# Patient Record
Sex: Male | Born: 1960 | Race: Black or African American | Hispanic: No | Marital: Married | Smoking: Never smoker
Health system: Southern US, Community
[De-identification: ages and names within clinical notes are randomized; demographics above are authoritative.]

## PROBLEM LIST (undated history)

## (undated) HISTORY — PX: HAND SURGERY: SHX662

---

## 2002-12-03 ENCOUNTER — Emergency Department (HOSPITAL_COMMUNITY): Admission: EM | Admit: 2002-12-03 | Discharge: 2002-12-04 | Payer: Self-pay | Admitting: Emergency Medicine

## 2005-02-20 ENCOUNTER — Emergency Department (HOSPITAL_COMMUNITY): Admission: EM | Admit: 2005-02-20 | Discharge: 2005-02-21 | Payer: Self-pay | Admitting: Emergency Medicine

## 2006-03-25 ENCOUNTER — Emergency Department (HOSPITAL_COMMUNITY): Admission: EM | Admit: 2006-03-25 | Discharge: 2006-03-25 | Payer: Self-pay | Admitting: Emergency Medicine

## 2010-05-21 ENCOUNTER — Emergency Department (HOSPITAL_COMMUNITY): Admission: EM | Admit: 2010-05-21 | Discharge: 2010-05-21 | Payer: Self-pay | Admitting: Emergency Medicine

## 2010-10-17 IMAGING — CT CT ABD-PELV W/O CM
1 series · 14 of 20 positions shown, 19 images · non-contrast
Comparison: None.

CLINICAL DATA: Evaluate for kidney stone

CT ABDOMEN AND PELVIS WITHOUT CONTRAST
TECHNIQUE: Multidetector CT imaging of the abdomen and pelvis was
performed following the standard protocol without intravenous
contrast.

[Series 4: lung windows · axial · 0.76mm/px · z∈[-170,-85]mm · 14 of 20 slices shown, 19 images]
[im 2/20  soft-tissue]
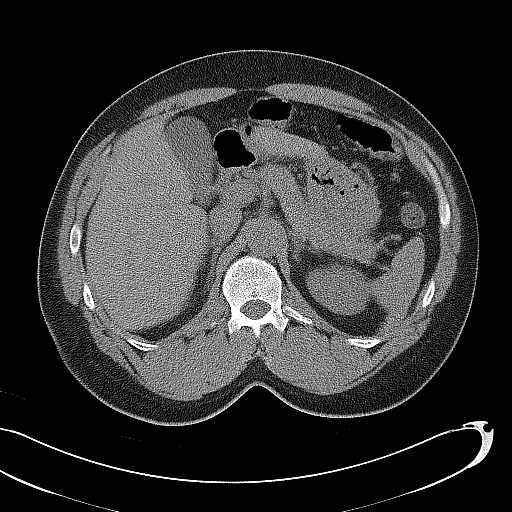
[im 2/20  bone]
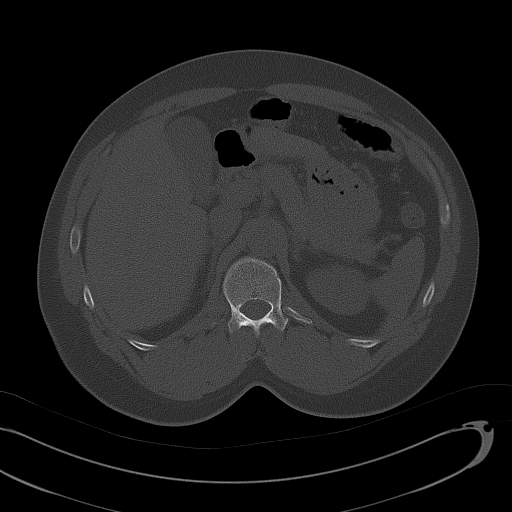
[im 4/20  soft-tissue]
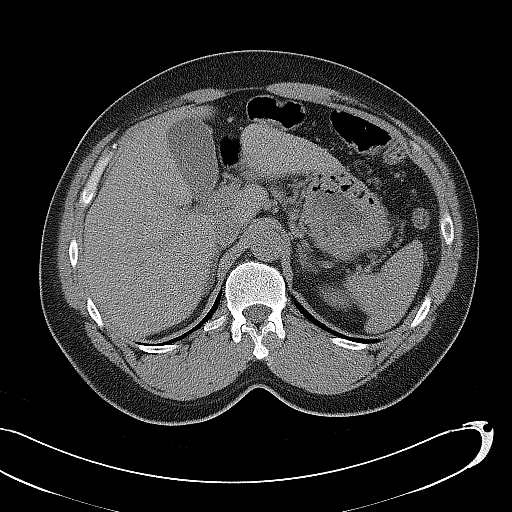
[im 5/20  soft-tissue]
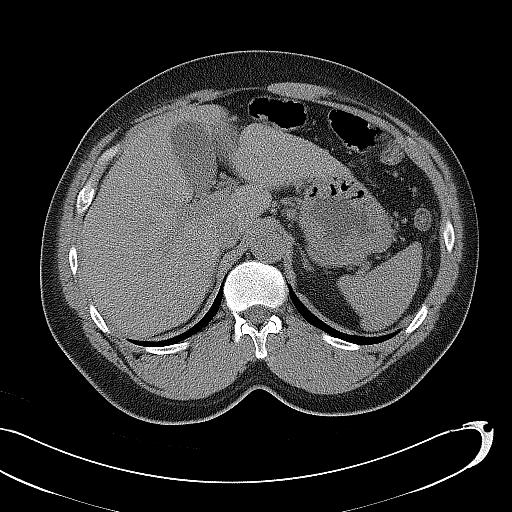
[im 6/20  soft-tissue]
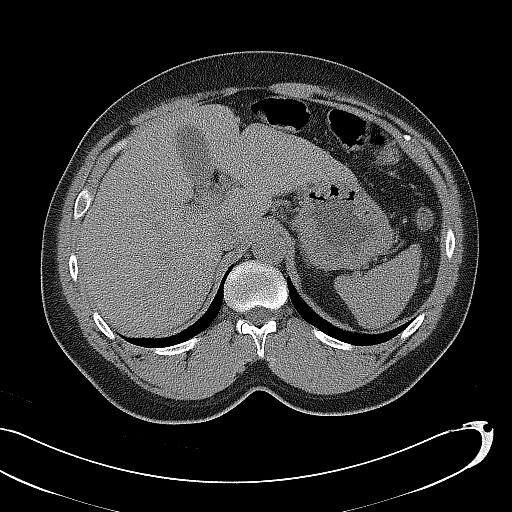
[im 8/20  soft-tissue]
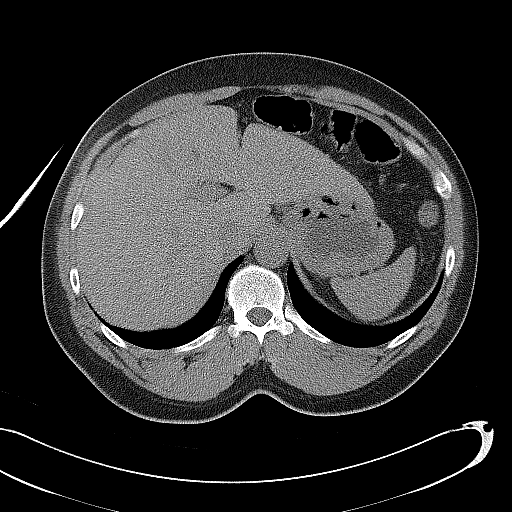
[im 9/20  soft-tissue]
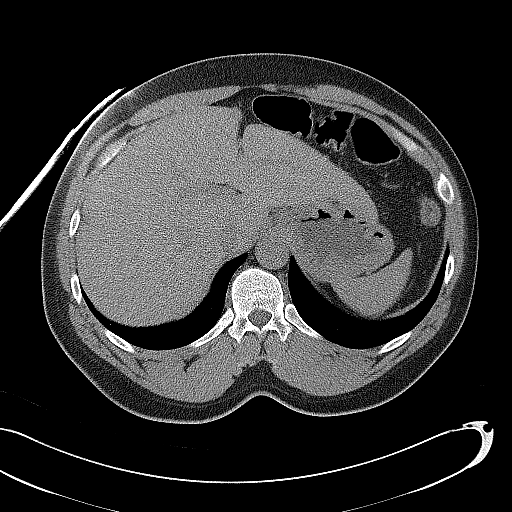
[im 11/20  soft-tissue]
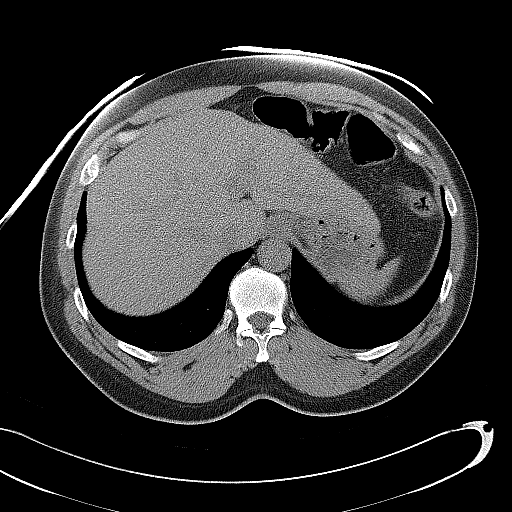
[im 12/20  soft-tissue]
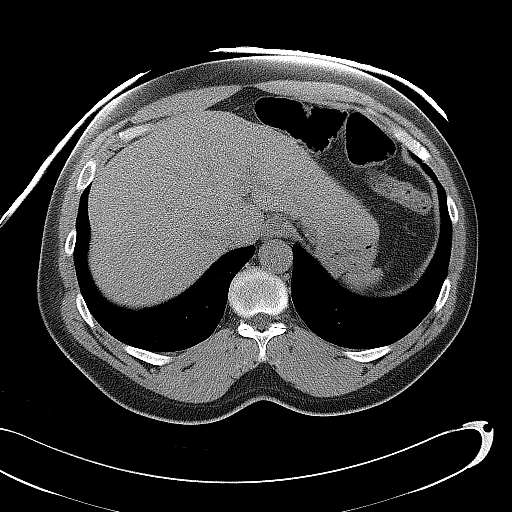
[im 13/20  soft-tissue]
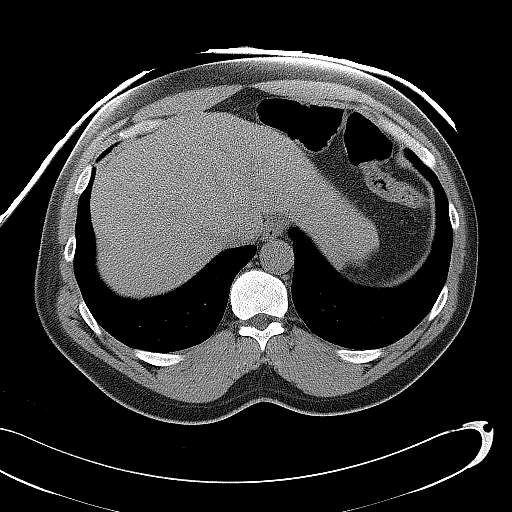
[im 13/20  bone]
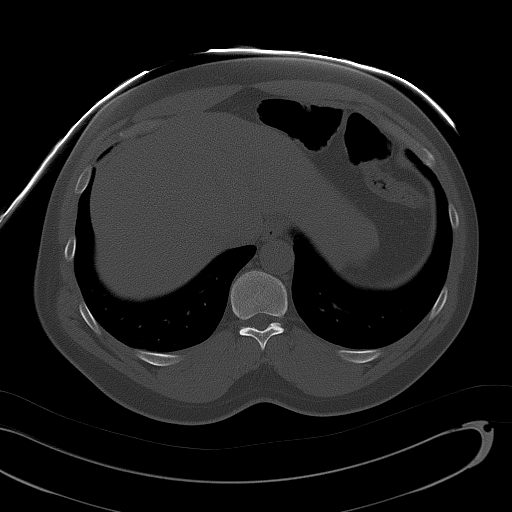
[im 15/20  soft-tissue]
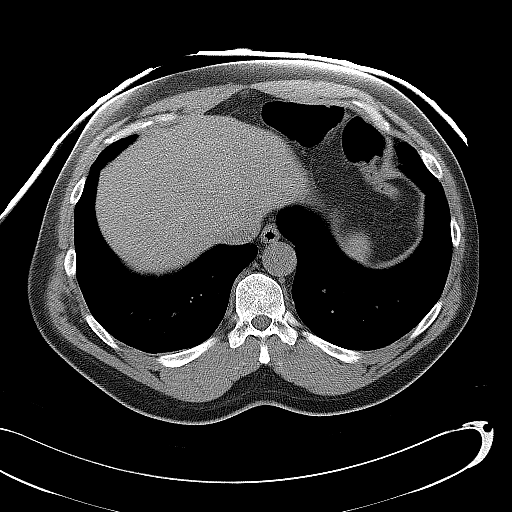
[im 16/20  soft-tissue]
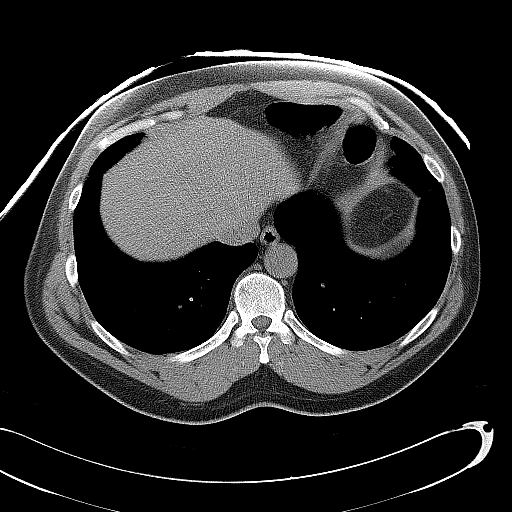
[im 16/20  lung]
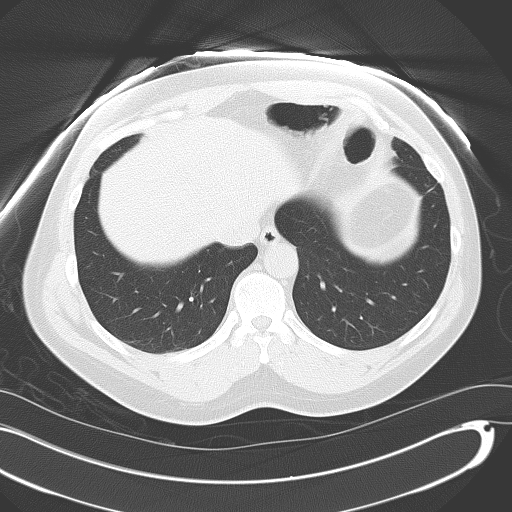
[im 17/20  soft-tissue]
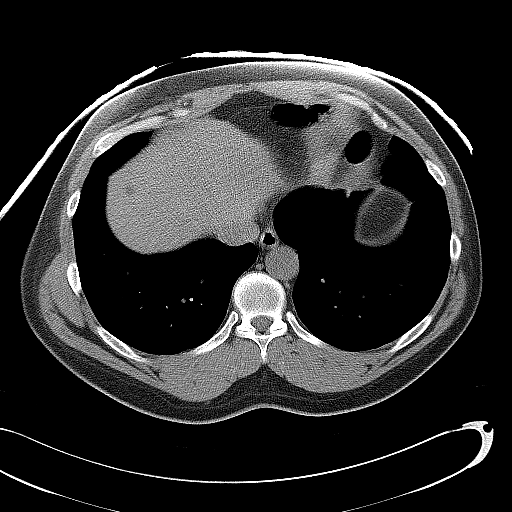
[im 17/20  lung]
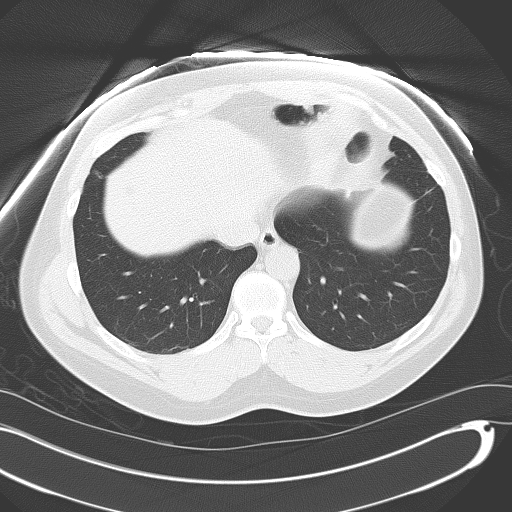
[im 18/20  lung]
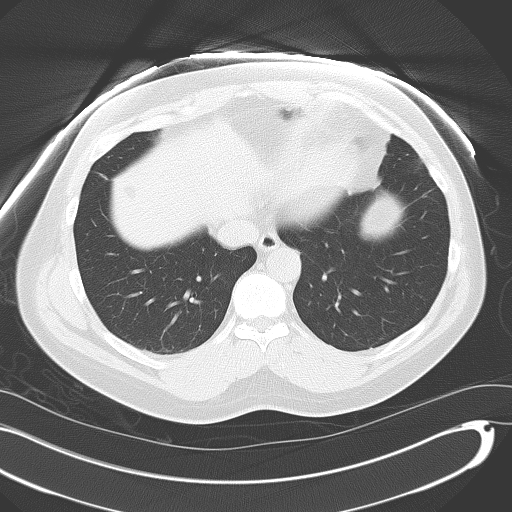
[im 19/20  soft-tissue]
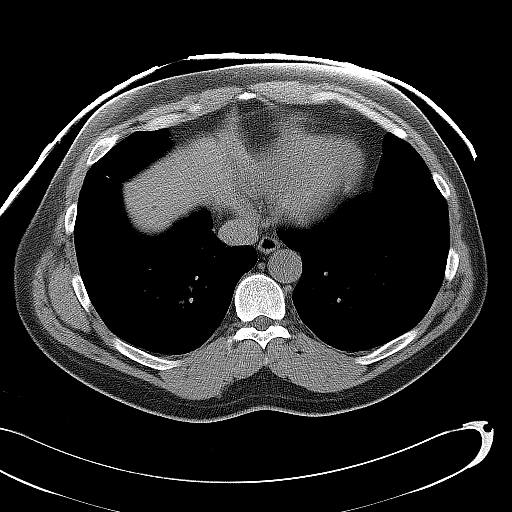
[im 19/20  lung]
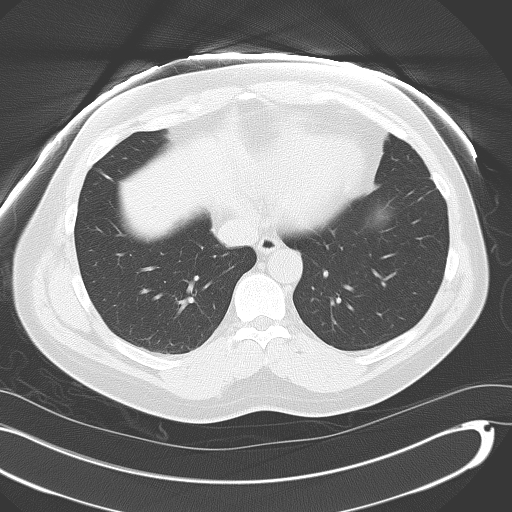

[14 of 20 positions shown; findings below may reference images not displayed]

FINDINGS: There is a small cyst within the right hepatic lobe which
measures 10 mm, image #1.

Tiny hypodensity within the inferior right hepatic lobe measures
4.4 mm, image 19.  This is too small to characterize.

The spleen appears normal.

The adrenal glands are normal.

The pancreas is unremarkable. There is a punctate stone in the
upper pole collecting system the left kidney measuring 2.7 mm.  No
left-sided hydronephrosis or hydroureter.

There is right-sided nephromegaly, hydronephrosis and perinephric
fat stranding.  Right-sided hydroureter is noted.  In the expected
location of the distal third of the ureter and the right side of
the pelvis there is a calcific density measuring 3.2 mm, image 59.

No enlarged upper abdominal lymph nodes.

There is no free fluid or abnormal fluid collection identified
within the upper abdomen.

There is no free fluid in the pelvis.

There is a Foley catheter within the urinary bladder.

The pelvic bowel loops appear normal.

Review of the visualized osseous structures shows no acute
findings.
IMPRESSION: 1.  Right-sided hydronephrosis, perinephric fat stranding and
hydroureter.
2.  A calcific density is identified within the right side of the
pelvis in the expected location of the right ureter.  This is felt
likely represent a ureteral calculi
2.  Punctate nonobstructing left renal stone.

## 2010-11-13 ENCOUNTER — Encounter: Payer: Self-pay | Admitting: Ophthalmology

## 2011-01-08 LAB — URINE MICROSCOPIC-ADD ON

## 2011-01-08 LAB — URINALYSIS, ROUTINE W REFLEX MICROSCOPIC
Glucose, UA: NEGATIVE mg/dL
pH: 5.5 (ref 5.0–8.0)

## 2011-12-24 ENCOUNTER — Encounter (HOSPITAL_COMMUNITY): Payer: Self-pay | Admitting: *Deleted

## 2011-12-24 ENCOUNTER — Emergency Department (HOSPITAL_COMMUNITY)
Admission: EM | Admit: 2011-12-24 | Discharge: 2011-12-24 | Disposition: A | Discharge status: Home / Self Care | Payer: BC Managed Care – PPO | Attending: Emergency Medicine | Admitting: Emergency Medicine

## 2011-12-24 DIAGNOSIS — L723 Sebaceous cyst: Secondary | ICD-10-CM | POA: Insufficient documentation

## 2011-12-24 DIAGNOSIS — R221 Localized swelling, mass and lump, neck: Secondary | ICD-10-CM | POA: Insufficient documentation

## 2011-12-24 DIAGNOSIS — L089 Local infection of the skin and subcutaneous tissue, unspecified: Secondary | ICD-10-CM

## 2011-12-24 DIAGNOSIS — R220 Localized swelling, mass and lump, head: Secondary | ICD-10-CM | POA: Insufficient documentation

## 2011-12-24 MED ORDER — CLINDAMYCIN HCL 150 MG PO CAPS: 300.0000 mg | ORAL_CAPSULE | Freq: Three times a day (TID) | ORAL | Status: DC

## 2011-12-24 MED ORDER — CLINDAMYCIN HCL 150 MG PO CAPS: 300.0000 mg | ORAL_CAPSULE | Freq: Three times a day (TID) | ORAL | Status: AC

## 2011-12-24 MED ORDER — LIDOCAINE HCL 1 % IJ SOLN
INTRAMUSCULAR | Status: AC
  Administered 2011-12-24: 09:00:00
  Filled 2011-12-24: qty 20

## 2011-12-24 NOTE — ED Provider Notes (Signed)
History     CSN: [id number]  Arrival date & time 12/24/11  0807   First MD Initiated Contact with Patient 12/24/11 0831      Chief Complaint  Patient presents with  . Cyst    (Consider location/radiation/quality/duration/timing/severity/associated sxs/prior treatment) The history is provided by the patient.  Pt is a 51 y/o M who presents to ED via private vehicle with c/c of facial swelling. Reports several months of a pea-sized nodule to the right temple that has doubled in size in the last several days. Denies drainage. Denies it is painful. Denies fever, chills, HA, dizziness, visual change. No prior treatment.  History reviewed. No pertinent past medical history.  History reviewed. No pertinent past surgical history.  Family History  Problem Relation Age of Onset  . Diabetes Mother   . Hypertension Mother   . Diabetes Father   . Hypertension Father     History  Substance Use Topics  . Smoking status: Never Smoker   . Smokeless tobacco: Not on file  . Alcohol Use: 0.6 oz/week    1 Cans of beer per week     1 per day      Review of Systems  Constitutional: Negative for fever and chills.  HENT: Positive for facial swelling. Negative for ear pain, neck pain, neck stiffness and tinnitus.   Eyes: Negative for pain.       Positive right eye swelling, chronic and intermittent for several years, unchanged.  Skin:       See HPI  Neurological: Negative for dizziness and headaches.    Allergies  Review of patient's allergies indicates no known allergies.  Home Medications  No current outpatient prescriptions on file.  BP 149/93  Pulse 64  Temp(Src) 97.9 F (36.6 C) (Oral)  Resp 18  SpO2 96%  Physical Exam  Nursing note and vitals reviewed. Constitutional: He is oriented to person, place, and time. He appears well-developed and well-nourished. No distress.  HENT:  Head: Normocephalic.    Right Ear: External ear normal.  Left Ear: External ear normal.    Mouth/Throat: Oropharynx is clear and moist.  Eyes: Conjunctivae are normal. Pupils are equal, round, and reactive to light.       Right eyelid swollen, pt not able to open as widely as left eye- reports this is unchanged from chronic recurrence and does not want it further evaluated.  Neck: Normal range of motion. Neck supple.  Cardiovascular: Normal rate.   Pulmonary/Chest: No respiratory distress.  Lymphadenopathy:    He has no cervical adenopathy.  Neurological: He is alert and oriented to person, place, and time. No cranial nerve deficit.  Skin:       See HENT exam  Psychiatric: He has a normal mood and affect.    ED Course  INCISION AND DRAINAGE Date/Time: 12/24/2011 9:10 AM Performed by: Lorenz Coaster JUSTICE Authorized by: Lorenz Coaster JUSTICE Consent: Verbal consent obtained. Risks and benefits: risks, benefits and alternatives were discussed Consent given by: patient Patient understanding: patient states understanding of the procedure being performed Patient identity confirmed: verbally with patient Time out: Immediately prior to procedure a "time out" was called to verify the correct patient, procedure, equipment, support staff and site/side marked as required. Type: cyst Body area: head/neck Location details: scalp Anesthesia: local infiltration Local anesthetic: lidocaine 1% without epinephrine Anesthetic total: 0.2 ml Patient sedated: no Needle gauge: 18 Complexity: simple Drainage: purulent Drainage amount: moderate Wound treatment: wound left open Packing material: none Patient tolerance: Patient  tolerated the procedure well with no immediate complications.     1. 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 Purulent material drained. Will d/c on abx. Advised f/u with derm, general surg, or plastics for cyst removal as needed. Advised return to ER if fever, redness, worsening pain.        [address]  [place], [place] 12/24/11 [phone]

## 2011-12-24 NOTE — ED Notes (Signed)
Pt reports noted swelling to right temporal area for a few months. Pt reports no pain. Pt denies history of the same.

## 2011-12-24 NOTE — ED Provider Notes (Signed)
Medical screening examination/treatment/procedure(s) were performed by non-physician practitioner and as supervising physician I was immediately available for consultation/collaboration.   Tawna Alwin, MD 12/24/11 1344 

## 2011-12-24 NOTE — Discharge Instructions (Signed)
You can try calling the numbers above to get help with having your cyst cut out. Please take the antibiotic as directed to treat the current infection. Use ibuprofen or tylenol as needed for pain. Return to the ER if you develop fever, significant pain, or redness.    Epidermal Cyst An epidermal cyst is sometimes called a sebaceous cyst, epidermal inclusion cyst, or infundibular cyst. These cysts usually contain a substance that looks "pasty" or "cheesy" and may have a bad smell. This substance is a protein called keratin. Epidermal cysts are usually found on the face, neck, or trunk. They may also occur in the vaginal area or other parts of the genitalia of both men and women. Epidermal cysts are usually small, painless, slow-growing bumps or lumps that move freely under the skin. It is important not to try to pop them. This may cause an infection and lead to tenderness and swelling. CAUSES  Epidermal cysts may be caused by a deep penetrating injury to the skin or a plugged hair follicle, often associated with acne. SYMPTOMS  Epidermal cysts can become inflamed and cause:  Redness.   Tenderness.   Increased temperature of the skin over the bumps or lumps.   Grayish-white, bad smelling material that drains from the bump or lump.  DIAGNOSIS  Epidermal cysts are easily diagnosed by your caregiver during an exam. Rarely, a tissue sample (biopsy) may be taken to rule out other conditions that may resemble epidermal cysts. TREATMENT   Epidermal cysts often get better and disappear on their own. They are rarely ever cancerous.   If a cyst becomes infected, it may become inflamed and tender. This may require opening and draining the cyst. Treatment with antibiotics may be necessary. When the infection is gone, the cyst may be removed with minor surgery.   Small, inflamed cysts can often be treated with antibiotics or by injecting steroid medicines.   Sometimes, epidermal cysts become large and  bothersome. If this happens, surgical removal in your caregiver's office may be necessary.  HOME CARE INSTRUCTIONS  Only take over-the-counter or prescription medicines as directed by your caregiver.   Take your antibiotics as directed. Finish them even if you start to feel better.  SEEK MEDICAL CARE IF:   Your cyst becomes tender, red, or swollen.   Your condition is not improving or is getting worse.   You have any other questions or concerns.  MAKE SURE YOU:  Understand these instructions.   Will watch your condition.   Will get help right away if you are not doing well or get worse.  Document Released: 09/10/2004 Document Revised: 06/22/2011 Document Reviewed: 04/18/2011 [facility] Patient Information 2012 [place], [place].

## 2012-03-24 ENCOUNTER — Emergency Department (HOSPITAL_BASED_OUTPATIENT_CLINIC_OR_DEPARTMENT_OTHER)
Admission: EM | Admit: 2012-03-24 | Discharge: 2012-03-25 | Disposition: A | Discharge status: Home / Self Care | Payer: BC Managed Care – PPO | Attending: Emergency Medicine | Admitting: Emergency Medicine

## 2012-03-24 ENCOUNTER — Encounter (HOSPITAL_BASED_OUTPATIENT_CLINIC_OR_DEPARTMENT_OTHER): Payer: Self-pay | Admitting: *Deleted

## 2012-03-24 DIAGNOSIS — L0291 Cutaneous abscess, unspecified: Secondary | ICD-10-CM

## 2012-03-24 DIAGNOSIS — L0211 Cutaneous abscess of neck: Secondary | ICD-10-CM | POA: Insufficient documentation

## 2012-03-24 MED ORDER — DOXYCYCLINE HYCLATE 100 MG PO TABS
100.0000 mg | ORAL_TABLET | Freq: Once | ORAL | Status: AC
  Administered 2012-03-24: 100 mg via ORAL
  Filled 2012-03-24: qty 1

## 2012-03-24 MED ORDER — DOXYCYCLINE HYCLATE 100 MG PO CAPS: 100.0000 mg | ORAL_CAPSULE | Freq: Two times a day (BID) | ORAL | Status: DC

## 2012-03-24 NOTE — ED Provider Notes (Signed)
History     CSN: [id number]  Arrival date & time 03/24/12  1927   First MD Initiated Contact with Patient 03/24/12 2148      Chief Complaint  Patient presents with  . Recurrent Skin Infections    (Consider location/radiation/quality/duration/timing/severity/associated sxs/prior treatment) HPI Comments: Abscess to L neck.  Started ~ 2 weeks ago.  Had one about 2 years ago.  The history is provided by the patient. No language interpreter was used.    History reviewed. No pertinent past medical history.  History reviewed. No pertinent past surgical history.  Family History  Problem Relation Age of Onset  . Diabetes Mother   . Hypertension Mother   . Diabetes Father   . Hypertension Father     History  Substance Use Topics  . Smoking status: Never Smoker   . Smokeless tobacco: Never Used  . Alcohol Use: 4.2 oz/week    7 Cans of beer per week     1 per day      Review of Systems  Constitutional: Negative for fever and chills.  Musculoskeletal:       Abscess   All other systems reviewed and are negative.    Allergies  Review of patient's allergies indicates no known allergies.  Home Medications   Current Outpatient Rx  Name Route Sig Dispense Refill  . DOXYCYCLINE HYCLATE 100 MG PO CAPS Oral Take 1 capsule (100 mg total) by mouth 2 (two) times daily. 20 capsule 0    BP 137/104  Pulse 70  Temp(Src) 98.4 F (36.9 C) (Oral)  Resp 18  Ht 5\' 6"  (1.676 m)  Wt 190 lb (86.183 kg)  BMI 30.67 kg/m2  SpO2 99%  Physical Exam  Nursing note and vitals reviewed. Constitutional: He is oriented to person, place, and time. He appears well-developed and well-nourished.  HENT:  Head: Normocephalic and atraumatic.  Eyes: EOM are normal.  Neck: Trachea normal, normal range of motion and phonation normal. No spinous process tenderness and no muscular tenderness present. Normal range of motion present.    Cardiovascular: Normal rate, regular rhythm, normal heart  sounds and intact distal pulses.   Pulmonary/Chest: Effort normal and breath sounds normal. No respiratory distress.  Abdominal: Soft. He exhibits no distension. There is no tenderness.  Musculoskeletal: Normal range of motion.  Neurological: He is alert and oriented to person, place, and time.  Skin: Skin is warm and dry.  Psychiatric: He has a normal mood and affect. Judgment normal.    ED Course  INCISION AND DRAINAGE Date/Time: 03/24/2012 10:40 PM Performed by: Worthy Rancher Authorized by: Worthy Rancher Consent: Verbal consent obtained. Written consent not obtained. Risks and benefits: risks, benefits and alternatives were discussed Consent given by: patient Patient understanding: patient states understanding of the procedure being performed Site marked: the operative site was not marked Imaging studies: imaging studies not available Required items: required blood products, implants, devices, and special equipment available Patient identity confirmed: verbally with patient Time out: Immediately prior to procedure a "time out" was called to verify the correct patient, procedure, equipment, support staff and site/side marked as required. Type: abscess Body area: head/neck Location details: neck Anesthesia: local infiltration Local anesthetic: lidocaine 1% with epinephrine Anesthetic total: 4 ml Patient sedated: no Risk factor: underlying major vessel Scalpel size: 11 Incision type: single straight Complexity: simple Drainage: purulent Drainage amount: copious Wound treatment: drain placed Packing material: 1/4 in iodoform gauze (packed with ~ 18 inches of iodoform) Patient tolerance: Patient tolerated  the procedure well with no immediate complications. Comments: Pt and wife instructed to remove ~ 3 inches of packing daiyl until gone(5-6 days).     (including critical care time)  Labs Reviewed - No data to display No results found.   1. Abscess       MDM    Doxycycline BID, 20 Warm compresses. Remove 3 inches of packing daily.  Return prn        Worthy Rancher, [place] 03/24/12 2317

## 2012-03-24 NOTE — Discharge Instructions (Signed)
Abscess Care After An abscess (also called a boil or furuncle) is an infected area that contains a collection of pus. Signs and symptoms of an abscess include pain, tenderness, redness, or hardness, or you may feel a moveable soft area under your skin. An abscess can occur anywhere in the body. The infection may spread to surrounding tissues causing cellulitis. A cut (incision) by the surgeon was made over your abscess and the pus was drained out. Gauze may have been packed into the space to provide a drain that will allow the cavity to heal from the inside outwards. The boil may be painful for 5 to 7 days. Most people with a boil do not have high fevers. Your abscess, if seen early, may not have localized, and may not have been lanced. If not, another appointment may be required for this if it does not get better on its own or with medications. HOME CARE INSTRUCTIONS   Only take over-the-counter or prescription medicines for pain, discomfort, or fever as directed by your caregiver.   When you bathe, soak and then remove gauze or iodoform packs at least daily or as directed by your caregiver. You may then wash the wound gently with mild soapy water. Repack with gauze or do as your caregiver directs.  SEEK IMMEDIATE MEDICAL CARE IF:   You develop increased pain, swelling, redness, drainage, or bleeding in the wound site.   You develop signs of generalized infection including muscle aches, chills, fever, or a general ill feeling.   An oral temperature above 102 F (38.9 C) develops, not controlled by medication.  See your caregiver for a recheck if you develop any of the symptoms described above. If medications (antibiotics) were prescribed, take them as directed. Document Released: 04/28/2005 Document Revised: 09/29/2011 Document Reviewed: 12/24/2007 [facility] Patient Information 2012 [place], [place].   Take the antibiotic as directed.  Apply warm compresses 10-15 min several times daily.  Remove  about 3 inches of packing daily until gone.  Take ibuprofen up to 800 mg every 8 hrs with food.  Return to the ED if you have any problems.

## 2012-03-24 NOTE — ED Notes (Signed)
Guaze dressing applied

## 2012-03-24 NOTE — ED Notes (Signed)
Pt has boil to left side of neck x 2 weeks

## 2012-03-24 NOTE — ED Provider Notes (Signed)
Medical screening examination/treatment/procedure(s) were performed by non-physician practitioner and as supervising physician I was immediately available for consultation/collaboration.   Trenity Pha, MD 03/24/12 2326 

## 2012-03-25 MED ORDER — SULFAMETHOXAZOLE-TRIMETHOPRIM 800-160 MG PO TABS: 1.0000 | ORAL_TABLET | Freq: Two times a day (BID) | ORAL | Status: AC

## 2012-03-25 NOTE — ED Provider Notes (Signed)
02Jun: Pt called ED stated to RN vomits with doxycycline can't tolerate it so Rx for Bactrim DS ordered.  Hurman Horn, MD 03/25/12 [phone]

## 2013-05-14 ENCOUNTER — Emergency Department (HOSPITAL_COMMUNITY)
Admission: EM | Admit: 2013-05-14 | Discharge: 2013-05-15 | Disposition: A | Discharge status: Home / Self Care | Payer: Self-pay | Attending: Emergency Medicine | Admitting: Emergency Medicine

## 2013-05-14 ENCOUNTER — Encounter (HOSPITAL_COMMUNITY): Payer: Self-pay | Admitting: *Deleted

## 2013-05-14 DIAGNOSIS — R197 Diarrhea, unspecified: Secondary | ICD-10-CM | POA: Insufficient documentation

## 2013-05-14 DIAGNOSIS — N390 Urinary tract infection, site not specified: Secondary | ICD-10-CM | POA: Insufficient documentation

## 2013-05-14 DIAGNOSIS — K529 Noninfective gastroenteritis and colitis, unspecified: Secondary | ICD-10-CM

## 2013-05-14 DIAGNOSIS — R112 Nausea with vomiting, unspecified: Secondary | ICD-10-CM | POA: Insufficient documentation

## 2013-05-14 DIAGNOSIS — K5289 Other specified noninfective gastroenteritis and colitis: Secondary | ICD-10-CM | POA: Insufficient documentation

## 2013-05-14 LAB — URINALYSIS, ROUTINE W REFLEX MICROSCOPIC
Glucose, UA: NEGATIVE mg/dL
Ketones, ur: NEGATIVE mg/dL
Protein, ur: NEGATIVE mg/dL
pH: 5 (ref 5.0–8.0)

## 2013-05-14 LAB — URINE MICROSCOPIC-ADD ON

## 2013-05-14 MED ORDER — ONDANSETRON 8 MG PO TBDP
8.0000 mg | ORAL_TABLET | Freq: Once | ORAL | Status: AC
  Administered 2013-05-14: 8 mg via ORAL
  Filled 2013-05-14: qty 1

## 2013-05-14 MED ORDER — ONDANSETRON HCL 4 MG/2ML IJ SOLN
4.0000 mg | Freq: Once | INTRAMUSCULAR | Status: DC
  Filled 2013-05-14: qty 2

## 2013-05-14 NOTE — ED Provider Notes (Signed)
History    CSN: [id number] Arrival date & time 05/14/13  2157  First MD Initiated Contact with Patient 05/14/13 2228     Chief Complaint  Patient presents with  . Chills  . Nausea  . Emesis  . Diarrhea   (Consider location/radiation/quality/duration/timing/severity/associated sxs/prior Treatment) HPI History provided by pt.   Pt presents w/ several episodes of vomiting and diarrhea since this am.  No associated fever, CP, SOB, cough, abdominal pain, flank pain, hematemesis/hematochezia/melena.  Has had hematuria today which is new, but otherwise no urinary sx.  Ate chinese take out last night.  No known sick contacts.  No PMH.  No h/o abdominal surgeries.  Drinks ~1 beer/d.   History reviewed. No pertinent past medical history. Past Surgical History  Procedure Laterality Date  . Hand surgery     Family History  Problem Relation Age of Onset  . Diabetes Mother   . Hypertension Mother   . Diabetes Father   . Hypertension Father    History  Substance Use Topics  . Smoking status: Never Smoker   . Smokeless tobacco: Never Used  . Alcohol Use: 4.2 oz/week    7 Cans of beer per week     Comment: 1 per day    Review of Systems  All other systems reviewed and are negative.    Allergies  Review of patient's allergies indicates no known allergies.  Home Medications   Current Outpatient Rx  Name  Route  Sig  Dispense  Refill  . DM-Doxylamine-Acetaminophen (NYQUIL COLD & FLU PO)   Oral   Take 5 mLs by mouth daily as needed.          BP 141/83  Pulse 97  Temp(Src) 98.6 F (37 C) (Oral)  Resp 18  SpO2 100% Physical Exam  Nursing note and vitals reviewed. Constitutional: He is oriented to person, place, and time. He appears well-developed and well-nourished. No distress.  HENT:  Head: Normocephalic and atraumatic.  Eyes:  Normal appearance  Neck: Normal range of motion.  Cardiovascular: Normal rate and regular rhythm.   Pulmonary/Chest: Effort normal and  breath sounds normal. No respiratory distress.  Abdominal: Soft. Bowel sounds are normal. He exhibits no distension and no mass. There is no tenderness. There is no rebound and no guarding.  obese  Genitourinary:  No CVA tenderness  Musculoskeletal: Normal range of motion.  Neurological: He is alert and oriented to person, place, and time.  Skin: Skin is warm and dry. No rash noted.  Psychiatric: He has a normal mood and affect. His behavior is normal.    ED Course  Procedures (including critical care time) Labs Reviewed  URINALYSIS, ROUTINE W REFLEX MICROSCOPIC - Abnormal; Notable for the following:    APPearance CLOUDY (*)    Hgb urine dipstick MODERATE (*)    Nitrite POSITIVE (*)    Leukocytes, UA SMALL (*)    All other components within normal limits  URINE MICROSCOPIC-ADD ON - Abnormal; Notable for the following:    Squamous Epithelial / LPF FEW (*)    Bacteria, UA MANY (*)    All other components within normal limits  URINE CULTURE   No results found. 1. Gastroenteritis   2. UTI (lower urinary tract infection)     MDM  52yo healthy M presents w/ N/V/D since this am.  Simultaneous onset hematuria but otherwise no associated symptoms.  On exam, afebrile, NAD, well-hydrated, abd benign.  Suspect viral gastroenteritis.  I do not feel that labs,  other than U/A, are necessary at this time.  Offered IVF and anti-emetic and patient is comfortable w/ trialing po first.  8mg  ODT zofran ordered.  Will reassess shortly.  11:07 PM   Nausea controlled and pt tolerating pos.  U/A positive for infection which is the likely cause of hematuria.  Prescribed cipro as well as zofran and recommended fluids and BRAT diet at home.  Advised return for fever, severe abd pain or uncontrolled vomiting.  1:03 AM   Otilio Miu, PA-C 05/15/13 [phone]

## 2013-05-14 NOTE — ED Notes (Signed)
Pt reports n/v/d and chills that began this a.m. - pt states he took some niquil approx 1300 today w/o relief. Pt admits to being unable to tolerate any PO intake at present and is concerned he may be dehydrated.

## 2013-05-15 MED ORDER — CEPHALEXIN 500 MG PO CAPS: 1000.0000 mg | ORAL_CAPSULE | Freq: Two times a day (BID) | ORAL | Status: DC

## 2013-05-15 MED ORDER — CIPROFLOXACIN HCL 500 MG PO TABS: 500.0000 mg | ORAL_TABLET | Freq: Two times a day (BID) | ORAL | Status: DC

## 2013-05-15 MED ORDER — ONDANSETRON HCL 8 MG PO TABS: 8.0000 mg | ORAL_TABLET | Freq: Three times a day (TID) | ORAL | Status: DC | PRN

## 2013-05-17 LAB — URINE CULTURE

## 2013-05-19 ENCOUNTER — Telehealth (HOSPITAL_COMMUNITY): Payer: Self-pay | Admitting: Emergency Medicine

## 2013-05-19 NOTE — ED Notes (Signed)
Post ED Visit - Positive Culture Follow-up  Culture report reviewed by antimicrobial stewardship pharmacist: []  Wes Dulaney, Pharm.D., BCPS [x]  Celedonio Miyamoto, Pharm.D., BCPS []  Georgina Pillion, Pharm.D., BCPS []  [place], [address].D., BCPS, AAHIVP []  Estella Husk, Pharm.D., BCPS, AAHIVP  Positive urine culture Treated with Keflex, organism sensitive to the same and no further patient follow-up is required at this time.  Kylie A Holland 05/19/2013, 9:41 AM

## 2013-06-03 ENCOUNTER — Encounter (HOSPITAL_COMMUNITY): Payer: Self-pay | Admitting: *Deleted

## 2013-06-03 ENCOUNTER — Emergency Department (HOSPITAL_COMMUNITY): Payer: BC Managed Care – PPO

## 2013-06-03 ENCOUNTER — Emergency Department (HOSPITAL_COMMUNITY)
Admission: EM | Admit: 2013-06-03 | Discharge: 2013-06-03 | Disposition: A | Discharge status: Home / Self Care | Payer: BC Managed Care – PPO | Attending: Emergency Medicine | Admitting: Emergency Medicine

## 2013-06-03 DIAGNOSIS — Y92410 Unspecified street and highway as the place of occurrence of the external cause: Secondary | ICD-10-CM | POA: Insufficient documentation

## 2013-06-03 DIAGNOSIS — IMO0002 Reserved for concepts with insufficient information to code with codable children: Secondary | ICD-10-CM | POA: Insufficient documentation

## 2013-06-03 DIAGNOSIS — S50811A Abrasion of right forearm, initial encounter: Secondary | ICD-10-CM

## 2013-06-03 DIAGNOSIS — S5010XA Contusion of unspecified forearm, initial encounter: Secondary | ICD-10-CM | POA: Insufficient documentation

## 2013-06-03 DIAGNOSIS — Z79899 Other long term (current) drug therapy: Secondary | ICD-10-CM | POA: Insufficient documentation

## 2013-06-03 DIAGNOSIS — Y9389 Activity, other specified: Secondary | ICD-10-CM | POA: Insufficient documentation
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
## 2013-06-03 NOTE — ED Provider Notes (Signed)
Medical screening examination/treatment/procedure(s) were performed by non-physician practitioner and as supervising physician I was immediately available for consultation/collaboration.  Geoffery Lyons, MD 06/03/13 1505
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
## 2013-06-03 NOTE — ED Provider Notes (Signed)
Medical screening examination/treatment/procedure(s) were conducted as a shared visit with non-physician practitioner(s) and myself.  I personally evaluated the patient during the encounter.   No acute abdomen.   rx for uti  Donnetta Hutching, MD 06/03/13 [phone]

## 2013-10-30 IMAGING — CR DG FOREARM 2V*R*
2 series · 2 of 2 positions shown · non-contrast
Comparison: None.

CLINICAL DATA: Pain post trauma

RIGHT FOREARM - 2 VIEW

[x forearm ap right]
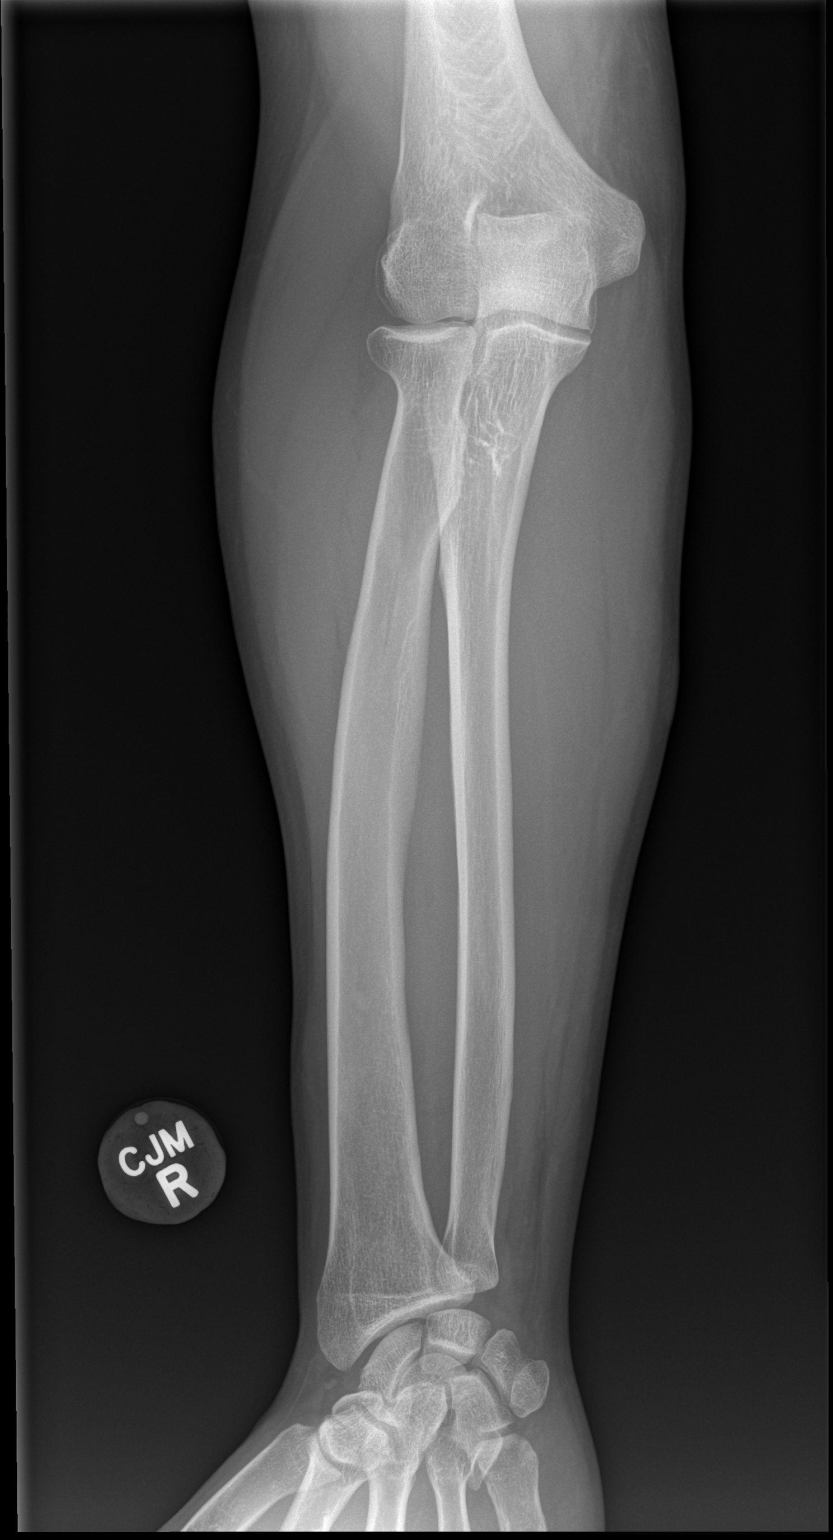

[x forearm lat right]
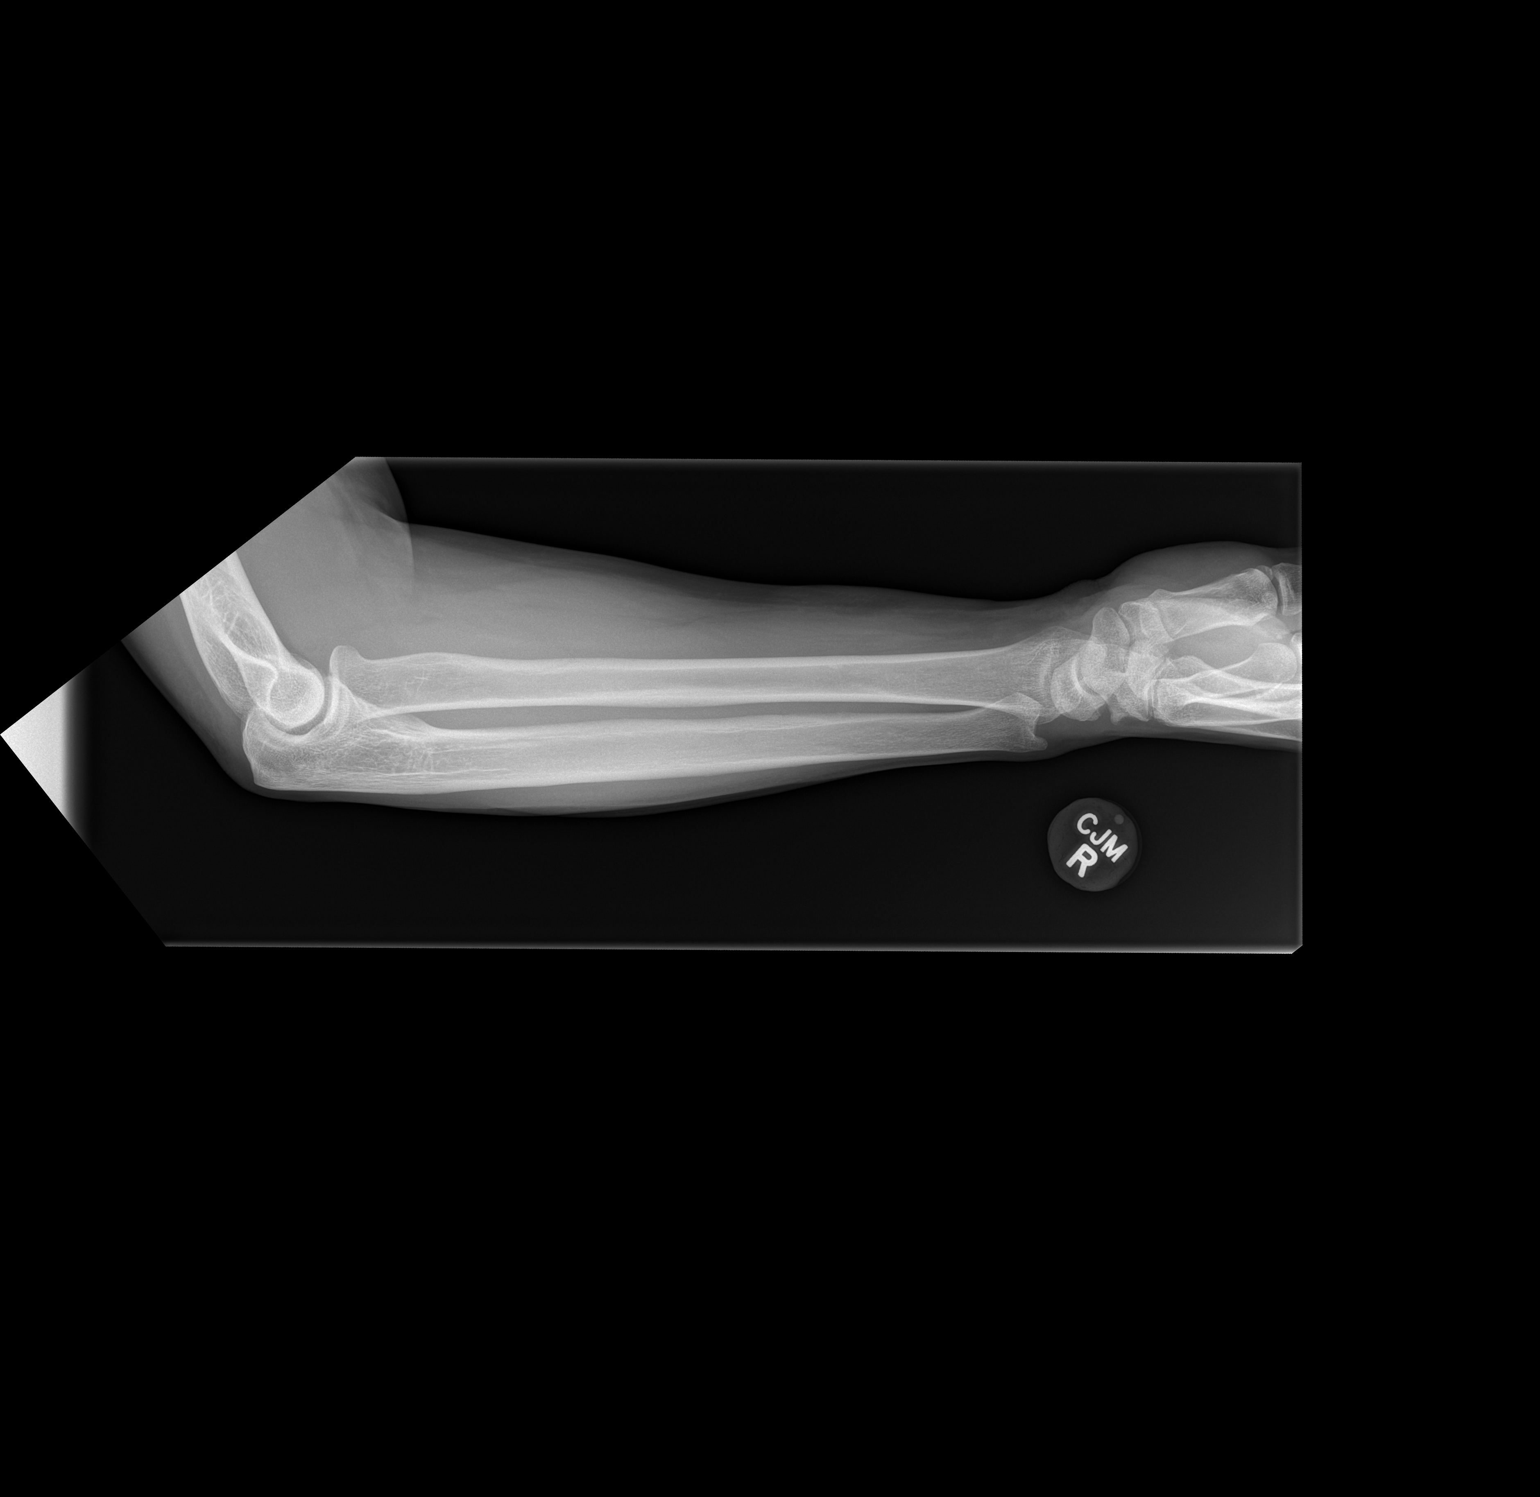

[2 of 2 positions shown; findings below may reference images not displayed]

FINDINGS: Frontal and lateral views were obtained.  No fracture or
dislocation.  No radiopaque foreign body.  No abnormal periosteal
reaction.  Joint spaces appear intact.

Incidental note is made of a minus ulnar variant.
IMPRESSION: No fracture or dislocation.

## 2013-12-23 ENCOUNTER — Emergency Department (HOSPITAL_COMMUNITY): Payer: Worker's Compensation | Admitting: Certified Registered Nurse Anesthetist

## 2013-12-23 ENCOUNTER — Encounter (HOSPITAL_COMMUNITY): Payer: Self-pay | Admitting: Emergency Medicine

## 2013-12-23 ENCOUNTER — Encounter (HOSPITAL_COMMUNITY): Payer: Worker's Compensation | Admitting: Certified Registered Nurse Anesthetist

## 2013-12-23 ENCOUNTER — Ambulatory Visit (HOSPITAL_COMMUNITY)
Admission: EM | Admit: 2013-12-23 | Discharge: 2013-12-23 | Disposition: A | Discharge status: Home / Self Care | Payer: Worker's Compensation | Attending: Emergency Medicine | Admitting: Emergency Medicine

## 2013-12-23 ENCOUNTER — Emergency Department (HOSPITAL_COMMUNITY): Payer: Worker's Compensation

## 2013-12-23 ENCOUNTER — Encounter (HOSPITAL_COMMUNITY)
Admission: EM | Disposition: A | Discharge status: Home / Self Care | Payer: Self-pay | Source: Home / Self Care | Attending: Emergency Medicine

## 2013-12-23 DIAGNOSIS — S61219A Laceration without foreign body of unspecified finger without damage to nail, initial encounter: Secondary | ICD-10-CM

## 2013-12-23 DIAGNOSIS — W278XXA Contact with other nonpowered hand tool, initial encounter: Secondary | ICD-10-CM | POA: Insufficient documentation

## 2013-12-23 DIAGNOSIS — W269XXA Contact with unspecified sharp object(s), initial encounter: Secondary | ICD-10-CM | POA: Insufficient documentation

## 2013-12-23 DIAGNOSIS — S61209A Unspecified open wound of unspecified finger without damage to nail, initial encounter: Secondary | ICD-10-CM | POA: Insufficient documentation

## 2013-12-23 DIAGNOSIS — Y9289 Other specified places as the place of occurrence of the external cause: Secondary | ICD-10-CM | POA: Insufficient documentation

## 2013-12-23 DIAGNOSIS — Y990 Civilian activity done for income or pay: Secondary | ICD-10-CM | POA: Insufficient documentation

## 2013-12-23 HISTORY — PX: SKIN FULL THICKNESS GRAFT: SHX442

## 2013-12-23 HISTORY — PX: I & D EXTREMITY: SHX5045

## 2013-12-23 SURGERY — IRRIGATION AND DEBRIDEMENT EXTREMITY
Anesthesia: General | Site: Hand | Laterality: Left

## 2013-12-23 MED ORDER — LACTATED RINGERS IV SOLN
INTRAVENOUS | Status: DC | PRN
  Administered 2013-12-23 (×2): via INTRAVENOUS

## 2013-12-23 MED ORDER — ONDANSETRON HCL 4 MG/2ML IJ SOLN
INTRAMUSCULAR | Status: DC | PRN
Start: 2013-12-23 — End: 2013-12-23
  Administered 2013-12-23: 4 mg via INTRAVENOUS

## 2013-12-23 MED ORDER — ONDANSETRON HCL 4 MG/2ML IJ SOLN
INTRAMUSCULAR | Status: AC
  Filled 2013-12-23: qty 2

## 2013-12-23 MED ORDER — PROPOFOL 10 MG/ML IV BOLUS
INTRAVENOUS | Status: DC | PRN
  Administered 2013-12-23: 170 mg via INTRAVENOUS

## 2013-12-23 MED ORDER — FENTANYL CITRATE 0.05 MG/ML IJ SOLN
INTRAMUSCULAR | Status: AC
  Filled 2013-12-23: qty 5

## 2013-12-23 MED ORDER — FENTANYL CITRATE 0.05 MG/ML IJ SOLN
INTRAMUSCULAR | Status: DC | PRN
  Administered 2013-12-23: 25 ug via INTRAVENOUS
  Administered 2013-12-23: 100 ug via INTRAVENOUS

## 2013-12-23 MED ORDER — ROCURONIUM BROMIDE 50 MG/5ML IV SOLN
INTRAVENOUS | Status: AC
  Filled 2013-12-23: qty 1

## 2013-12-23 MED ORDER — LIDOCAINE HCL (CARDIAC) 20 MG/ML IV SOLN
INTRAVENOUS | Status: AC
  Filled 2013-12-23: qty 5

## 2013-12-23 MED ORDER — BUPIVACAINE HCL (PF) 0.25 % IJ SOLN
INTRAMUSCULAR | Status: AC
  Filled 2013-12-23: qty 30

## 2013-12-23 MED ORDER — LIDOCAINE HCL (CARDIAC) 20 MG/ML IV SOLN
INTRAVENOUS | Status: DC | PRN
  Administered 2013-12-23: 70 mg via INTRAVENOUS

## 2013-12-23 MED ORDER — PROPOFOL 10 MG/ML IV BOLUS
INTRAVENOUS | Status: AC
  Filled 2013-12-23: qty 20

## 2013-12-23 MED ORDER — MIDAZOLAM HCL 5 MG/5ML IJ SOLN
INTRAMUSCULAR | Status: DC | PRN
  Administered 2013-12-23: 2 mg via INTRAVENOUS

## 2013-12-23 MED ORDER — OXYCODONE HCL 5 MG/5ML PO SOLN: 5.0000 mg | Freq: Once | ORAL | Status: DC | PRN

## 2013-12-23 MED ORDER — CEFAZOLIN SODIUM-DEXTROSE 2-3 GM-% IV SOLR
INTRAVENOUS | Status: DC | PRN
  Administered 2013-12-23: 2 g via INTRAVENOUS

## 2013-12-23 MED ORDER — HYDROMORPHONE HCL PF 1 MG/ML IJ SOLN: 0.2500 mg | INTRAMUSCULAR | Status: DC | PRN

## 2013-12-23 MED ORDER — PHENYLEPHRINE HCL 10 MG/ML IJ SOLN
INTRAMUSCULAR | Status: DC | PRN
  Administered 2013-12-23: 40 ug via INTRAVENOUS
  Administered 2013-12-23: 80 ug via INTRAVENOUS
  Administered 2013-12-23: 40 ug via INTRAVENOUS

## 2013-12-23 MED ORDER — EPHEDRINE SULFATE 50 MG/ML IJ SOLN
INTRAMUSCULAR | Status: DC | PRN
  Administered 2013-12-23 (×2): 5 mg via INTRAVENOUS

## 2013-12-23 MED ORDER — GLYCOPYRROLATE 0.2 MG/ML IJ SOLN
INTRAMUSCULAR | Status: DC | PRN
  Administered 2013-12-23 (×2): 0.1 mg via INTRAVENOUS

## 2013-12-23 MED ORDER — PROMETHAZINE HCL 25 MG/ML IJ SOLN: 6.2500 mg | INTRAMUSCULAR | Status: DC | PRN

## 2013-12-23 MED ORDER — OXYCODONE HCL 5 MG PO TABS: 5.0000 mg | ORAL_TABLET | Freq: Once | ORAL | Status: DC | PRN

## 2013-12-23 MED ORDER — AMOXICILLIN-POT CLAVULANATE 875-125 MG PO TABS
1.0000 | ORAL_TABLET | Freq: Once | ORAL | Status: AC
  Administered 2013-12-23: 1 via ORAL
  Filled 2013-12-23: qty 1

## 2013-12-23 MED ORDER — BUPIVACAINE HCL (PF) 0.25 % IJ SOLN
INTRAMUSCULAR | Status: DC | PRN
  Administered 2013-12-23: 14 mL

## 2013-12-23 MED ORDER — SODIUM CHLORIDE 0.9 % IR SOLN
Status: DC | PRN
  Administered 2013-12-23: 1000 mL

## 2013-12-23 MED ORDER — MIDAZOLAM HCL 2 MG/2ML IJ SOLN
INTRAMUSCULAR | Status: AC
  Filled 2013-12-23: qty 2

## 2013-12-23 MED ORDER — TETANUS-DIPHTH-ACELL PERTUSSIS 5-2.5-18.5 LF-MCG/0.5 IM SUSP
0.5000 mL | Freq: Once | INTRAMUSCULAR | Status: AC
  Administered 2013-12-23: 0.5 mL via INTRAMUSCULAR
  Filled 2013-12-23: qty 0.5

## 2013-12-23 SURGICAL SUPPLY — 48 items
BAG DECANTER FOR FLEXI CONT (MISCELLANEOUS) ×1 IMPLANT
BALL CTTN LRG ABS STRL LF (GAUZE/BANDAGES/DRESSINGS) ×1
BANDAGE COBAN STERILE 2 (GAUZE/BANDAGES/DRESSINGS) ×2 IMPLANT
BANDAGE CONFORM 2  STR LF (GAUZE/BANDAGES/DRESSINGS) ×2 IMPLANT
BANDAGE CONFORM 3  STR LF (GAUZE/BANDAGES/DRESSINGS) ×2 IMPLANT
BANDAGE ELASTIC 3 VELCRO ST LF (GAUZE/BANDAGES/DRESSINGS) IMPLANT
BANDAGE ELASTIC 4 VELCRO ST LF (GAUZE/BANDAGES/DRESSINGS) IMPLANT
BANDAGE GAUZE ELAST BULKY 4 IN (GAUZE/BANDAGES/DRESSINGS) IMPLANT
BNDG COHESIVE 1X5 TAN STRL LF (GAUZE/BANDAGES/DRESSINGS) ×2 IMPLANT
CORDS BIPOLAR (ELECTRODE) ×2 IMPLANT
COTTONBALL LRG STERILE PKG (GAUZE/BANDAGES/DRESSINGS) ×2 IMPLANT
CUFF TOURNIQUET SINGLE 18IN (TOURNIQUET CUFF) ×2 IMPLANT
DRAPE SURG 17X23 STRL (DRAPES) ×3 IMPLANT
ELECT REM PT RETURN 9FT ADLT (ELECTROSURGICAL)
ELECTRODE REM PT RTRN 9FT ADLT (ELECTROSURGICAL) IMPLANT
GAUZE PACKING IODOFORM 1/4X5 (PACKING) IMPLANT
GAUZE XEROFORM 1X8 LF (GAUZE/BANDAGES/DRESSINGS) ×3 IMPLANT
GLOVE BIOGEL M STRL SZ7.5 (GLOVE) ×3 IMPLANT
GOWN STRL NON-REIN LRG LVL3 (GOWN DISPOSABLE) ×6 IMPLANT
HANDPIECE INTERPULSE COAX TIP (DISPOSABLE)
KIT BASIN OR (CUSTOM PROCEDURE TRAY) ×3 IMPLANT
KIT ROOM TURNOVER OR (KITS) ×3 IMPLANT
MANIFOLD NEPTUNE II (INSTRUMENTS) ×1 IMPLANT
NDL HYPO 25GX1X1/2 BEV (NEEDLE) IMPLANT
NEEDLE HYPO 25GX1X1/2 BEV (NEEDLE) ×3 IMPLANT
NS IRRIG 1000ML POUR BTL (IV SOLUTION) ×3 IMPLANT
PACK ORTHO EXTREMITY (CUSTOM PROCEDURE TRAY) ×3 IMPLANT
PAD ARMBOARD 7.5X6 YLW CONV (MISCELLANEOUS) ×6 IMPLANT
PAD CAST 4YDX4 CTTN HI CHSV (CAST SUPPLIES) IMPLANT
PADDING CAST COTTON 4X4 STRL (CAST SUPPLIES)
SET HNDPC FAN SPRY TIP SCT (DISPOSABLE) IMPLANT
SOAP 2 % CHG 4 OZ (WOUND CARE) ×3 IMPLANT
SPONGE GAUZE 4X4 12PLY (GAUZE/BANDAGES/DRESSINGS) ×1 IMPLANT
SPONGE GAUZE 4X4 12PLY STER LF (GAUZE/BANDAGES/DRESSINGS) ×2 IMPLANT
SPONGE LAP 18X18 X RAY DECT (DISPOSABLE) IMPLANT
SPONGE LAP 4X18 X RAY DECT (DISPOSABLE) ×1 IMPLANT
SUT ETHILON 4 0 PS 2 18 (SUTURE) ×2 IMPLANT
SUT ETHILON 5 0 P 3 18 (SUTURE) ×2
SUT NYLON ETHILON 5-0 P-3 1X18 (SUTURE) IMPLANT
SUT VIC AB 4-0 TF 27 (SUTURE) ×2 IMPLANT
SYR CONTROL 10ML LL (SYRINGE) ×2 IMPLANT
TOWEL OR 17X24 6PK STRL BLUE (TOWEL DISPOSABLE) ×3 IMPLANT
TOWEL OR 17X26 10 PK STRL BLUE (TOWEL DISPOSABLE) ×3 IMPLANT
TUBE ANAEROBIC SPECIMEN COL (MISCELLANEOUS) IMPLANT
TUBE CONNECTING 12'X1/4 (SUCTIONS) ×1
TUBE CONNECTING 12X1/4 (SUCTIONS) ×2 IMPLANT
WATER STERILE IRR 1000ML POUR (IV SOLUTION) ×1 IMPLANT
YANKAUER SUCT BULB TIP NO VENT (SUCTIONS) ×1 IMPLANT

## 2013-12-23 NOTE — ED Notes (Signed)
Op permit signed by pt after procedure explained to pt by Dr. Izora Ribasoley.

## 2013-12-23 NOTE — ED Notes (Signed)
Per pt sts he was at work cutting some meat and sliced left index finger. Still slowly bleeding.

## 2013-12-23 NOTE — ED Notes (Signed)
Ortho MD in to assess pt at this time.

## 2013-12-23 NOTE — Discharge Instructions (Signed)
Discharge Instructions:  Keep your dressing clean, dry and in place until instructed to remove by Dr. Taiwana Willison.  If the dressing becomes dirty or wet call the office for instructions during business hours. Elevate the extremity to help with swelling, this will also help with any discomfort. Take your medication as prescribed. No lifting with the injured  extremity. If you feel that the dressing is too tight, you may loosen it, but keep it on; finger tips should be pink; if there is a concern, call the office. [phone] Ice may be used if the injury is a fracture, do not apply ice directly to the skin. Please call the office on the next business day after discharge to arrange a follow up appointment.  Call [phone] between the hours of 9am - 5pm M-Th or 9am - 1pm on Fri. For most hand injuries and/or conditions, you may return to work using the uninjured hand (one handed duty) within 24-72 hours.  A detailed note will be provided to you at your follow up appointment or may contact the office prior to your follow up.    

## 2013-12-23 NOTE — ED Notes (Signed)
All belongings given to pt's wife  

## 2013-12-23 NOTE — H&P (Signed)
Reason for Consult:laceration LIF Referring Physician: ER  CC:I cut my finger  HPI:  Mason Thomas is an 53 y.o. right handed male who presents with   Saw injury from work to [employer]IF, tissue missing .   Pain is rated at  4  /10 and is described as shar.  Pain is constant.  Pain is made better by rest/immobilization, worse with motion.   Associated signs/symptoms: bleeding, altered sensation to radial side of finger Previous treatment:  Yes proximal laceration  History reviewed. No pertinent past medical history.  Past Surgical History  Procedure Laterality Date  . Hand surgery      Family History  Problem Relation Age of Onset  . Diabetes Mother   . Hypertension Mother   . Diabetes Father   . Hypertension Father     Social History:  reports that he has never smoked. He has never used smokeless tobacco. He reports that he drinks about 4.2 ounces of alcohol per week. He reports that he uses illicit drugs (Marijuana).  Allergies: No Known Allergies  Medications: I have reviewed the patient's current medications.  No results found for this or any previous visit (from the past 48 hour(s)).  Dg Finger Index Left  12/23/2013   CLINICAL DATA:  Finger injury, laceration.  EXAM: LEFT INDEX FINGER 2+V  COMPARISON:  None.  FINDINGS: Soft tissue defect noted along the distal aspect of the left index finger. No radiopaque foreign bodies. No underlying bony abnormality. No fracture, subluxation or dislocation.  IMPRESSION: No acute bony abnormality.   Electronically Signed   By: Charlett NoseKevin  Dover M.D.   On: 12/23/2013 15:41    Pertinent items are noted in HPI. Temp:  [97.5 F (36.4 C)] 97.5 F (36.4 C) (03/02 1402) Pulse Rate:  [71] 71 (03/02 1402) Resp:  [19] 19 (03/02 1402) BP: (155)/(75) 155/75 mmHg (03/02 1402) SpO2:  [100 %] 100 % (03/02 1402) Weight:  [89.812 kg (198 lb)] 89.812 kg (198 lb) (03/02 1402) General appearance: alert and cooperative Resp: clear to auscultation  bilaterally Cardio: regular rate and rhythm GI: soft, non-tender; bowel sounds normal; no masses,  no organomegaly Extremities: extremities normal, atraumatic, no cyanosis or edema Except for LIF with complex laceration , tissue loss on radial side of finger from dipj distally, involving part of nail.  Laceration extends back to pipj, full flexion present, decreased sensation of radial side of distal digit  Assessment: Complex laceration of LIF Plan: To OR for wound debridement, skin graft, discussed possibility of graft failure, need to reoperation I have discussed this treatment plan in detail with patient and/or family, including the risks of the recommended treatment or surgery, the benefits and the alternatives.  The patient and/or caregiver understands that additional treatment may be necessary.  Mason Thomas Mason Thomas 12/23/2013, 5:55 PM

## 2013-12-23 NOTE — Anesthesia Preprocedure Evaluation (Signed)
Anesthesia Evaluation  Patient identified by MRN, date of birth, ID band Patient awake    Reviewed: Allergy & Precautions, H&P , NPO status , Patient's Chart, lab work & pertinent test results  History of Anesthesia Complications Negative for: history of anesthetic complications  Airway Mallampati: I  Neck ROM: Full    Dental no notable dental hx. (+) Teeth Intact   Pulmonary neg pulmonary ROS,  breath sounds clear to auscultation  Pulmonary exam normal       Cardiovascular negative cardio ROS  IRhythm:Regular Rate:Normal     Neuro/Psych negative neurological ROS  negative psych ROS   GI/Hepatic negative GI ROS, Neg liver ROS,   Endo/Other  negative endocrine ROS  Renal/GU negative Renal ROS  negative genitourinary   Musculoskeletal   Abdominal   Peds  Hematology negative hematology ROS (+)   Anesthesia Other Findings   Reproductive/Obstetrics negative OB ROS                           Anesthesia Physical Anesthesia Plan  ASA: I and emergent  Anesthesia Plan: General   Post-op Pain Management:    Induction: Intravenous  Airway Management Planned: LMA  Additional Equipment:   Intra-op Plan:   Post-operative Plan: Extubation in OR  Informed Consent: I have reviewed the patients History and Physical, chart, labs and discussed the procedure including the risks, benefits and alternatives for the proposed anesthesia with the patient or authorized representative who has indicated his/her understanding and acceptance.     Plan Discussed with: CRNA and Surgeon  Anesthesia Plan Comments:         Anesthesia Quick Evaluation

## 2013-12-23 NOTE — Transfer of Care (Signed)
Immediate Anesthesia Transfer of Care Note  Patient: Mason Thomas  Procedure(s) Performed: Procedure(s): IRRIGATION AND DEBRIDEMENT Left Index Finger with Full thickness skin graft from left wrist to index finger (Left) SKIN GRAFT FULL THICKNESS (Left)  Patient Location: PACU  Anesthesia Type:General  Level of Consciousness: awake, alert  and oriented  Airway & Oxygen Therapy: Patient Spontanous Breathing and Patient connected to nasal cannula oxygen  Post-op Assessment: Report given to PACU RN and Post -op Vital signs reviewed and stable  Post vital signs: Reviewed and stable  Complications: No apparent anesthesia complications

## 2013-12-23 NOTE — Anesthesia Postprocedure Evaluation (Signed)
  Anesthesia Post-op Note  Patient: Mason Thomas  Procedure(s) Performed: Procedure(s): IRRIGATION AND DEBRIDEMENT Left Index Finger with Full thickness skin graft from left wrist to index finger (Left) SKIN GRAFT FULL THICKNESS (Left)  Patient Location: PACU  Anesthesia Type:General  Level of Consciousness: awake and alert   Airway and Oxygen Therapy: Patient Spontanous Breathing  Post-op Pain: none  Post-op Assessment: Post-op Vital signs reviewed  Post-op Vital Signs: stable  Complications: No apparent anesthesia complications

## 2013-12-23 NOTE — ED Provider Notes (Signed)
CSN: [id number]     Arrival date & time 12/23/13  1352 History  This chart was scribed for non-physician practitioner, Mason QualeHobson Lavoris Canizales, PA-C working with Mason AngerKathleen M McManus, DO by Greggory StallionKayla Andersen, ED scribe. This patient was seen in room TR06C/TR06C and the patient's care was started at 3:48 PM.   Chief Complaint  Patient presents with  . Finger Injury   The history is provided by the patient. No language interpreter was used.   HPI Comments: Mason Thomas is a 53 y.o. male who presents to the Emergency Department complaining of left index finger laceration that occurred earlier today. He states he was cutting some meat and sliced his finger. Pt has sudden onset left index finger pain. His last tetanus was greater than 5 years ago.   History reviewed. No pertinent past medical history. Past Surgical History  Procedure Laterality Date  . Hand surgery     Family History  Problem Relation Age of Onset  . Diabetes Mother   . Hypertension Mother   . Diabetes Father   . Hypertension Father    History  Substance Use Topics  . Smoking status: Never Smoker   . Smokeless tobacco: Never Used  . Alcohol Use: 4.2 oz/week    7 Cans of beer per week     Comment: 1 per day    Review of Systems  Musculoskeletal: Positive for arthralgias.  Skin: Positive for wound.  All other systems reviewed and are negative.   Allergies  Review of patient's allergies indicates no known allergies.  Home Medications  No current outpatient prescriptions on file.  BP 155/75  Pulse 71  Temp(Src) 97.5 F (36.4 C) (Oral)  Resp 19  Wt 198 lb (89.812 kg)  SpO2 100%  Physical Exam  Nursing note and vitals reviewed. Constitutional: He is oriented to person, place, and time. He appears well-developed and well-nourished. No distress.  HENT:  Head: Normocephalic and atraumatic.  Eyes: EOM are normal.  Proptosis of the right eye  Neck: Neck supple. No tracheal deviation present.  Cardiovascular: Normal  rate.   Pulmonary/Chest: Effort normal. No respiratory distress.  Musculoskeletal: Normal range of motion.  The patient has a 4 cm laceration of the radial aspect of the lateral left index finger. There is no tendon involvement. There is a laceration/avulsion wound to the distal left index finger, with extensive tissue loss from the DIP joint to the tip of the finger. There is some oozing of blood, mostly controlled with pressure.  Neurological: He is alert and oriented to person, place, and time.  There is slight decreased sensation at the distal left index finger. No other motor sensory deficits appreciated of the upper or lower extremities.  Skin: Skin is warm and dry.  Left index finger has a 4 cm laceration of the palmar surface and an extensive evulsion of the distal tip.  Psychiatric: He has a normal mood and affect. His behavior is normal.    ED Course  Procedures (including critical care time)  DIAGNOSTIC STUDIES: Oxygen Saturation is 100% on RA, normal by my interpretation.    COORDINATION OF CARE: 3:51 PM-Discussed treatment plan which includes having Dr. Richrd PrimeMcMannus look at his finger with pt at bedside and pt agreed to plan.   Labs Review Labs Reviewed - No data to display Imaging Review Dg Finger Index Left  12/23/2013   CLINICAL DATA:  Finger injury, laceration.  EXAM: LEFT INDEX FINGER 2+V  COMPARISON:  None.  FINDINGS: Soft tissue defect noted  along the distal aspect of the left index finger. No radiopaque foreign bodies. No underlying bony abnormality. No fracture, subluxation or dislocation.  IMPRESSION: No acute bony abnormality.   Electronically Signed   By: Charlett Nose M.D.   On: 12/23/2013 15:41     EKG Interpretation None      MDM Patient the meat with a band saw. He dropped a meat and sustained a flap-type laceration to the midportion of the left index finger. He then has an avulsion laceration of the distal tip of the finger. There is a small portion of the  sheath around the bone that is exposed.  X-ray of the index finger is negative for any bony abnormality of. He has good flexion and extension of the finger. He has good sensory of the finger. The patient has been seen with me by Dr. Clarene Duke. A call was placed to Dr. Izora Ribas (hand surgeon) and he will come to the ED to see the patient.  Patient has been given tetanus and Augmentin by mouth.    Final diagnoses:  None    **I have reviewed nursing notes, vital signs, and all appropriate lab and imaging results for this patient.*  **I personally performed the services described in this documentation, which was scribed in my presence. The recorded information has been reviewed and is accurate.Kathie Dike, PA-C 12/24/13 [phone]

## 2013-12-24 NOTE — Op Note (Signed)
NAMJerrel Thomas:  Balow, Maximus                ACCOUNT NO.:  [id number]  MEDICAL RECORD NO.:  [id number]  LOCATION:  [facility]                         FACILITY:  [facility]  PHYSICIAN:  Johnette AbrahamHarrill C [name], MD    DATE OF BIRTH:  August 03, 1961  DATE OF PROCEDURE:  12/23/2013 DATE OF DISCHARGE:  12/23/2013                              OPERATIVE REPORT   PREOPERATIVE DIAGNOSES:  Complex laceration and tissue loss of the left index finger status post a saw injury.  POSTOPERATIVE DIAGNOSES:  Complex laceration and tissue loss of the left index finger status post a saw injury.  PROCEDURE:  Debridement of wound in preparation of the wound bed for skin graft of the left index finger.  Defect measures approximately 3 cm x 1.5 cm, with an additional laceration measuring approximately 3 cm. Full-thickness skin graft placed; the donor site from the left ulnar wrist.  ANESTHESIA:  General.  ESTIMATED BLOOD LOSS:  Minimal.  COMPLICATIONS:  No acute complications.  SPECIMENS:  No specimens.  INDICATIONS:  Mr. Mason Thomas is a 53 year old gentleman who was sawing meat at work and sustained a laceration to his finger.  The laceration consisted of a laceration from the PIP joint, radial side of the finger distally with skin and tissue loss down the radial side of the distal phalanx.  In the ER, I was consulted for definitive repair.  Risks, benefits, and alternatives of skin graft were discussed with the patient including, but not limited to infection, bleeding, nerve injury, graft failure, need for reoperation.  The patient agreed with these risks and agreed to proceed with this surgery.  DESCRIPTION OF PROCEDURE:  The patient was taken to the operating room, placed supine on the operating table.  General anesthesia was administered without difficulty.  A time-out was performed.  The left upper extremity was prepped and draped in the normal sterile fashion. A tourniquet was used.  The arm was exsanguinated and the  tourniquet was elevated to 250 mmHg.  The wound laceration was evaluated and the laceration was down through the full-thickness skin into the subcutaneous tissue, the neurovascular bundles were not visualized.  The distal portion of the wound, there was tissue loss down to the periosteum and down to the radial aspect of the nail bed.  Full-thickness of skin and subcutaneous tissue were debrided back to create a nice clean bed.  The defect was measured to be approximately 3 cm x 1.5 cm.  An appropriate size donor graft was measured on the lateral ulnar wrist and an elliptical incision was made.  Full thickness skin was then harvested.  The donor site defect was then closed in multiple layers with interrupted 4-0 Vicryl and a subcuticular 4-0 nylon suture.  The laceration that was just proximal to the tissue defect was repaired with multiple interrupted 5-0 nylon sutures.  Following, the full- thickness skin graft was then pie crusted and sutured in place circumferentially around the defect. Several sutures were left long.  A small piece of cotton wrapped with a Xeroform gauze was used as a bolster over the defect and tightened with the long suture, and afterwards a sterile dressing was applied.  The patient tolerated the  procedure  well and was taken to the recovery in stable condition.     Johnette Abraham, MD     HCC/MEDQ  D:  12/23/2013  T:  12/24/2013  Job:  [id number]

## 2013-12-25 ENCOUNTER — Encounter (HOSPITAL_COMMUNITY): Payer: Self-pay | Admitting: General Surgery

## 2013-12-25 NOTE — ED Provider Notes (Signed)
Medical screening examination/treatment/procedure(s) were conducted as a shared visit with non-physician practitioner(s) and myself.  I personally evaluated the patient during the encounter.  53 yo M, s/p laceration to left index finger with saw while cutting meat PTA. Pt is right handed. VSS, A&O, resps easy, +4cm flap lac to lateral left index finger, as well as sparate approx 3cm diameter deep avulsion to distal lateral left index finger involving nail with visualized periosteum. F/E intact, decreased sensation. XR without fx. Bleeding minimal, controlled with pressure. Td updated, dose abx, Hand Surgery to take to OR.      Laray AngerKathleen M Larosa Rhines, DO 12/25/13 1949
